# Patient Record
Sex: Male | Born: 1990 | Race: Black or African American | Hispanic: No | Marital: Single | Smoking: Current every day smoker
Health system: Southern US, Community
[De-identification: ages and names within clinical notes are randomized; demographics above are authoritative.]

## PROBLEM LIST (undated history)

## (undated) DIAGNOSIS — F410 Panic disorder [episodic paroxysmal anxiety] without agoraphobia: Secondary | ICD-10-CM

## (undated) DIAGNOSIS — F22 Delusional disorders: Secondary | ICD-10-CM

## (undated) DIAGNOSIS — G43909 Migraine, unspecified, not intractable, without status migrainosus: Secondary | ICD-10-CM

---
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
## 2011-12-24 ENCOUNTER — Emergency Department (HOSPITAL_COMMUNITY)
Admission: EM | Admit: 2011-12-24 | Discharge: 2011-12-24 | Disposition: A | Discharge status: Home / Self Care | Payer: No Typology Code available for payment source | Attending: Emergency Medicine | Admitting: Emergency Medicine

## 2011-12-24 ENCOUNTER — Encounter (HOSPITAL_COMMUNITY): Payer: Self-pay | Admitting: Emergency Medicine

## 2011-12-24 DIAGNOSIS — S0990XA Unspecified injury of head, initial encounter: Secondary | ICD-10-CM | POA: Insufficient documentation

## 2011-12-24 DIAGNOSIS — F17200 Nicotine dependence, unspecified, uncomplicated: Secondary | ICD-10-CM | POA: Insufficient documentation

## 2011-12-24 DIAGNOSIS — R51 Headache: Secondary | ICD-10-CM

## 2011-12-24 HISTORY — DX: Migraine, unspecified, not intractable, without status migrainosus: G43.909

## 2011-12-24 MED ORDER — ACETAMINOPHEN 325 MG PO TABS
650.0000 mg | ORAL_TABLET | Freq: Once | ORAL | Status: AC
  Administered 2011-12-24: 650 mg via ORAL
  Filled 2011-12-24: qty 2
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
## 2011-12-24 NOTE — ED Notes (Signed)
PA in room to see pt 
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
## 2011-12-24 NOTE — ED Provider Notes (Signed)
Medical screening examination/treatment/procedure(s) were performed by non-physician practitioner and as supervising physician I was immediately available for consultation/collaboration.   Benny Lennert, MD 12/24/11 2325

## 2013-09-10 ENCOUNTER — Encounter (HOSPITAL_COMMUNITY): Payer: Self-pay | Admitting: Emergency Medicine

## 2013-09-10 ENCOUNTER — Emergency Department (HOSPITAL_COMMUNITY)
Admission: EM | Admit: 2013-09-10 | Discharge: 2013-09-10 | Disposition: A | Discharge status: Home / Self Care | Payer: 59 | Attending: Emergency Medicine | Admitting: Emergency Medicine

## 2013-09-10 DIAGNOSIS — K5289 Other specified noninfective gastroenteritis and colitis: Secondary | ICD-10-CM | POA: Insufficient documentation

## 2013-09-10 DIAGNOSIS — K529 Noninfective gastroenteritis and colitis, unspecified: Secondary | ICD-10-CM

## 2013-09-10 DIAGNOSIS — R109 Unspecified abdominal pain: Secondary | ICD-10-CM

## 2013-09-10 DIAGNOSIS — E860 Dehydration: Secondary | ICD-10-CM | POA: Insufficient documentation

## 2013-09-10 DIAGNOSIS — F17200 Nicotine dependence, unspecified, uncomplicated: Secondary | ICD-10-CM | POA: Insufficient documentation

## 2013-09-10 DIAGNOSIS — R112 Nausea with vomiting, unspecified: Secondary | ICD-10-CM

## 2013-09-10 DIAGNOSIS — Z8679 Personal history of other diseases of the circulatory system: Secondary | ICD-10-CM | POA: Insufficient documentation

## 2013-09-10 DIAGNOSIS — R000 Tachycardia, unspecified: Secondary | ICD-10-CM | POA: Insufficient documentation

## 2013-09-10 LAB — CBC WITH DIFFERENTIAL/PLATELET
BASOS ABS: 0.1 10*3/uL (ref 0.0–0.1)
Basophils Relative: 1 % (ref 0–1)
Eosinophils Absolute: 0 10*3/uL (ref 0.0–0.7)
Eosinophils Relative: 1 % (ref 0–5)
HCT: 43 % (ref 39.0–52.0)
HEMOGLOBIN: 15 g/dL (ref 13.0–17.0)
LYMPHS PCT: 18 % (ref 12–46)
Lymphs Abs: 1 10*3/uL (ref 0.7–4.0)
MCH: 29 pg (ref 26.0–34.0)
MCHC: 34.9 g/dL (ref 30.0–36.0)
MCV: 83 fL (ref 78.0–100.0)
MONO ABS: 1.3 10*3/uL — AB (ref 0.1–1.0)
MONOS PCT: 22 % — AB (ref 3–12)
NEUTROS PCT: 59 % (ref 43–77)
Neutro Abs: 3.4 10*3/uL (ref 1.7–7.7)
PLATELETS: 180 10*3/uL (ref 150–400)
RBC: 5.18 MIL/uL (ref 4.22–5.81)
RDW: 13.9 % (ref 11.5–15.5)
WBC: 5.7 10*3/uL (ref 4.0–10.5)

## 2013-09-10 LAB — COMPREHENSIVE METABOLIC PANEL
ALT: 16 U/L (ref 0–53)
AST: 20 U/L (ref 0–37)
Albumin: 3.9 g/dL (ref 3.5–5.2)
Alkaline Phosphatase: 70 U/L (ref 39–117)
BUN: 8 mg/dL (ref 6–23)
CO2: 24 mEq/L (ref 19–32)
Calcium: 9.1 mg/dL (ref 8.4–10.5)
Chloride: 100 mEq/L (ref 96–112)
Creatinine, Ser: 0.76 mg/dL (ref 0.50–1.35)
GFR calc Af Amer: >90 mL/min (ref >90)
GFR calc non Af Amer: >90 mL/min (ref >90)
Glucose, Bld: 84 mg/dL (ref 70–99)
Potassium: 3.5 mEq/L — ABNORMAL LOW (ref 3.7–5.3)
Sodium: 138 mEq/L (ref 137–147)
Total Bilirubin: 0.7 mg/dL (ref 0.3–1.2)
Total Protein: 7 g/dL (ref 6.0–8.3)

## 2013-09-10 LAB — URINALYSIS, ROUTINE W REFLEX MICROSCOPIC
Bilirubin Urine: NEGATIVE
Glucose, UA: NEGATIVE mg/dL
Hgb urine dipstick: NEGATIVE
Ketones, ur: 40 mg/dL — AB
Nitrite: NEGATIVE
Protein, ur: NEGATIVE mg/dL
Specific Gravity, Urine: 1.021 (ref 1.005–1.030)
Urobilinogen, UA: 1 mg/dL (ref 0.0–1.0)
pH: 6.5 (ref 5.0–8.0)

## 2013-09-10 LAB — URINE MICROSCOPIC-ADD ON

## 2013-09-10 LAB — LIPASE, BLOOD: LIPASE: 12 U/L (ref 11–59)

## 2013-09-10 LAB — MONONUCLEOSIS SCREEN: Mono Screen: NEGATIVE

## 2013-09-10 MED ORDER — ONDANSETRON 4 MG PO TBDP
8.0000 mg | ORAL_TABLET | Freq: Once | ORAL | Status: AC
  Administered 2013-09-10: 8 mg via ORAL
  Filled 2013-09-10: qty 2

## 2013-09-10 MED ORDER — KETOROLAC TROMETHAMINE 30 MG/ML IJ SOLN
30.0000 mg | Freq: Once | INTRAMUSCULAR | Status: AC
  Administered 2013-09-10: 30 mg via INTRAVENOUS
  Filled 2013-09-10: qty 1

## 2013-09-10 MED ORDER — ONDANSETRON 4 MG PO TBDP: ORAL_TABLET | ORAL | Status: DC

## 2013-09-10 MED ORDER — SODIUM CHLORIDE 0.9 % IV BOLUS (SEPSIS)
1000.0000 mL | Freq: Once | INTRAVENOUS | Status: AC
  Administered 2013-09-10: 1000 mL via INTRAVENOUS

## 2013-09-10 MED ORDER — ONDANSETRON HCL 4 MG/2ML IJ SOLN
4.0000 mg | Freq: Once | INTRAMUSCULAR | Status: AC
  Administered 2013-09-10: 4 mg via INTRAVENOUS
  Filled 2013-09-10: qty 2

## 2013-09-10 NOTE — ED Notes (Signed)
Pt sts onset of pain and not feeling well and back pain yesterday, and 2100 last pm he got worse and started vomiting last pm at 2300.

## 2013-09-10 NOTE — ED Notes (Signed)
Ginger Ale given for fluid challenge.  

## 2013-09-10 NOTE — ED Provider Notes (Signed)
Medical screening examination/treatment/procedure(s) were performed by non-physician practitioner and as supervising physician I was immediately available for consultation/collaboration.    Jermaine HornJohn M Jermaine Vellucci, MD 09/10/13 2117

## 2013-09-10 NOTE — ED Notes (Signed)
Pt reports sudden onset of nausea, vomiting, subjective fever since yesterday. Generalized abdominal pain. Denies diarrhea. No sick contacts at home.

## 2013-09-10 NOTE — ED Notes (Signed)
Pt states that he doesn't care to drink because it makes his stomach hurt/feel queasy; however, he reports his abdominal pain has improved

## 2013-09-10 NOTE — ED Provider Notes (Signed)
CSN: [id number]     Arrival date & time 09/10/13  [id number] History   First MD Initiated Contact with Patient 09/10/13 1022     Chief Complaint  Patient presents with  . Abdominal Pain  . Emesis     (Consider location/radiation/quality/duration/timing/severity/associated sxs/prior Treatment) HPI Comments: Patient is a 23 year old male with a past medical history of migraines who presents to the emergency department complaining of sudden onset nausea, vomiting and subjective fever beginning yesterday evening. Patient states he was feeling fine earlier in the day, went to work, had to cough hurts. When he got home from work he started to feel nauseated, vomited multiple times, developed a slight headache and eventually left-sided abdominal pain. Abdominal pain is has been constant since, described as cramping, "pretty bad". No specific aggravating or alleviating factors. He had a few more episodes of emesis earlier today, a few with streaks of bright red blood. No sick contacts. No history of abdominal surgeries. Denies diarrhea.  Patient is a 23 y.o. male presenting with abdominal pain and vomiting. The history is provided by the patient.  Abdominal Pain Associated symptoms: fever, nausea and vomiting   Emesis Associated symptoms: abdominal pain     Past Medical History  Diagnosis Date  . Migraine    History reviewed. No pertinent past surgical history. History reviewed. No pertinent family history. History  Substance Use Topics  . Smoking status: Current Every Day Smoker    Types: Cigarettes  . Smokeless tobacco: Not on file  . Alcohol Use: No    Review of Systems  Constitutional: Positive for fever and appetite change.  Gastrointestinal: Positive for nausea, vomiting and abdominal pain.  All other systems reviewed and are negative.      Allergies  Augmentin  Home Medications   Current Outpatient Rx  Name  Route  Sig  Dispense  Refill  . ondansetron (ZOFRAN ODT) 4 MG  disintegrating tablet      4mg  ODT q4 hours prn nausea/vomit   8 tablet   0    BP 117/68  Pulse 79  Temp(Src) 99.5 F (37.5 C) (Oral)  Resp 17  SpO2 99% Physical Exam  Nursing note and vitals reviewed. Constitutional: He is oriented to person, place, and time. He appears well-developed and well-nourished. No distress.  HENT:  Head: Normocephalic and atraumatic.  Mouth/Throat: Oropharynx is clear and moist.  Eyes: Conjunctivae are normal.  Neck: Normal range of motion. Neck supple.  Cardiovascular: Regular rhythm and normal heart sounds.   Tachy ~100  Pulmonary/Chest: Effort normal and breath sounds normal.  Abdominal: Soft. Bowel sounds are normal. He exhibits no distension. There is no splenomegaly. There is tenderness in the epigastric area, periumbilical area, suprapubic area, left upper quadrant and left lower quadrant. There is guarding. There is no rigidity and no rebound.  No peritoneal signs. Tenderness worse left mid-lower quadrant.  Musculoskeletal: Normal range of motion. He exhibits no edema.  Neurological: He is alert and oriented to person, place, and time.  Skin: Skin is warm.  Clammy.  Psychiatric: He has a normal mood and affect. His behavior is normal.    ED Course  Procedures (including critical care time) Labs Review Labs Reviewed  CBC WITH DIFFERENTIAL - Abnormal; Notable for the following:    Monocytes Relative 22 (*)    Monocytes Absolute 1.3 (*)    All other components within normal limits  COMPREHENSIVE METABOLIC PANEL - Abnormal; Notable for the following:    Potassium 3.5 (*)  All other components within normal limits  URINALYSIS, ROUTINE W REFLEX MICROSCOPIC - Abnormal; Notable for the following:    APPearance CLOUDY (*)    Ketones, ur 40 (*)    Leukocytes, UA TRACE (*)    All other components within normal limits  LIPASE, BLOOD  MONONUCLEOSIS SCREEN  URINE MICROSCOPIC-ADD ON   Imaging Review No results found.   EKG  Interpretation None      MDM   Final diagnoses:  Abdominal pain  Nausea and vomiting  Gastroenteritis   Patient presenting with abdominal pain, nausea, vomiting and subjective fever. He appears in no apparent distress. Mild tachycardia, slightly clammy. No sick contacts. Temperature 99.5. Vitals otherwise stable. Labs pending. Pain and nausea medication given along with fluid bolus. No peritoneal signs concerning for acute abdomen. 12:53 PM Patient reports some improvement of his symptoms with fluid, Toradol and Zofran. Labs without acute findings. Slightly dehydrated. On repeat abdominal exam, abdomen still tender, however marked improvement from initial evaluation. Fluid challenge given, patient states he did not care to drink at this time but feels well now to go home. Stable for discharge. Will discharge him Zofran. Return precautions given. Patient states understanding of treatment care plan and is agreeable.   Trevor Mace, PA-C 09/10/13 1256

## 2013-09-10 NOTE — Discharge Instructions (Signed)
Take zofran as directed as needed for nausea.  Abdominal Pain, Adult Many things can cause abdominal pain. Usually, abdominal pain is not caused by a disease and will improve without treatment. It can often be observed and treated at home. Your health care provider will do a physical exam and possibly order blood tests and X-rays to help determine the seriousness of your pain. However, in many cases, more time must pass before a clear cause of the pain can be found. Before that point, your health care provider may not know if you need more testing or further treatment. HOME CARE INSTRUCTIONS  Monitor your abdominal pain for any changes. The following actions may help to alleviate any discomfort you are experiencing:  Only take over-the-counter or prescription medicines as directed by your health care provider.  Do not take laxatives unless directed to do so by your health care provider.  Try a clear liquid diet (broth, tea, or water) as directed by your health care provider. Slowly move to a bland diet as tolerated. SEEK MEDICAL CARE IF:  You have unexplained abdominal pain.  You have abdominal pain associated with nausea or diarrhea.  You have pain when you urinate or have a bowel movement.  You experience abdominal pain that wakes you in the night.  You have abdominal pain that is worsened or improved by eating food.  You have abdominal pain that is worsened with eating fatty foods. SEEK IMMEDIATE MEDICAL CARE IF:   Your pain does not go away within 2 hours.  You have a fever.  You keep throwing up (vomiting).  Your pain is felt only in portions of the abdomen, such as the right side or the left lower portion of the abdomen.  You pass bloody or black tarry stools. MAKE SURE YOU:  Understand these instructions.   Will watch your condition.   Will get help right away if you are not doing well or get worse.  Document Released: 03/17/2005 Document Revised: 03/28/2013  Document Reviewed: 02/14/2013 [facility] Patient Information 2014 [place], [place].  Viral Gastroenteritis Viral gastroenteritis is also known as stomach flu. This condition affects the stomach and intestinal tract. It can cause sudden diarrhea and vomiting. The illness typically lasts 3 to 8 days. Most people develop an immune response that eventually gets rid of the virus. While this natural response develops, the virus can make you quite ill. CAUSES  Many different viruses can cause gastroenteritis, such as rotavirus or noroviruses. You can catch one of these viruses by consuming contaminated food or water. You may also catch a virus by sharing utensils or other personal items with an infected person or by touching a contaminated surface. SYMPTOMS  The most common symptoms are diarrhea and vomiting. These problems can cause a severe loss of body fluids (dehydration) and a body salt (electrolyte) imbalance. Other symptoms may include:  Fever.  Headache.  Fatigue.  Abdominal pain. DIAGNOSIS  Your caregiver can usually diagnose viral gastroenteritis based on your symptoms and a physical exam. A stool sample may also be taken to test for the presence of viruses or other infections. TREATMENT  This illness typically goes away on its own. Treatments are aimed at rehydration. The most serious cases of viral gastroenteritis involve vomiting so severely that you are not able to keep fluids down. In these cases, fluids must be given through an intravenous line (IV). HOME CARE INSTRUCTIONS   Drink enough fluids to keep your urine clear or pale yellow. Drink small amounts of  fluids frequently and increase the amounts as tolerated.  Ask your caregiver for specific rehydration instructions.  Avoid:  Foods high in sugar.  Alcohol.  Carbonated drinks.  Tobacco.  Juice.  Caffeine drinks.  Extremely hot or cold fluids.  Fatty, greasy foods.  Too much intake of anything at one  time.  Dairy products until 24 to 48 hours after diarrhea stops.  You may consume probiotics. Probiotics are active cultures of beneficial bacteria. They may lessen the amount and number of diarrheal stools in adults. Probiotics can be found in yogurt with active cultures and in supplements.  Wash your hands well to avoid spreading the virus.  Only take over-the-counter or prescription medicines for pain, discomfort, or fever as directed by your caregiver. Do not give aspirin to children. Antidiarrheal medicines are not recommended.  Ask your caregiver if you should continue to take your regular prescribed and over-the-counter medicines.  Keep all follow-up appointments as directed by your caregiver. SEEK IMMEDIATE MEDICAL CARE IF:   You are unable to keep fluids down.  You do not urinate at least once every 6 to 8 hours.  You develop shortness of breath.  You notice blood in your stool or vomit. This may look like coffee grounds.  You have abdominal pain that increases or is concentrated in one small area (localized).  You have persistent vomiting or diarrhea.  You have a fever.  The patient is a child younger than 3 months, and he or she has a fever.  The patient is a child older than 3 months, and he or she has a fever and persistent symptoms.  The patient is a child older than 3 months, and he or she has a fever and symptoms suddenly get worse.  The patient is a baby, and he or she has no tears when crying. MAKE SURE YOU:   Understand these instructions.  Will watch your condition.  Will get help right away if you are not doing well or get worse. Document Released: 06/07/2005 Document Revised: 08/30/2011 Document Reviewed: 03/24/2011 [facility] Patient Information 2014 [place]

## 2015-06-19 ENCOUNTER — Emergency Department (HOSPITAL_COMMUNITY)
Admission: EM | Admit: 2015-06-19 | Discharge: 2015-06-19 | Disposition: A | Discharge status: Home / Self Care | Payer: 59 | Attending: Emergency Medicine | Admitting: Emergency Medicine

## 2015-06-19 ENCOUNTER — Encounter (HOSPITAL_COMMUNITY): Payer: Self-pay | Admitting: *Deleted

## 2015-06-19 DIAGNOSIS — F419 Anxiety disorder, unspecified: Secondary | ICD-10-CM | POA: Diagnosis not present

## 2015-06-19 DIAGNOSIS — Z8679 Personal history of other diseases of the circulatory system: Secondary | ICD-10-CM | POA: Insufficient documentation

## 2015-06-19 DIAGNOSIS — F17210 Nicotine dependence, cigarettes, uncomplicated: Secondary | ICD-10-CM | POA: Diagnosis not present

## 2015-06-19 DIAGNOSIS — R4182 Altered mental status, unspecified: Secondary | ICD-10-CM

## 2015-06-19 DIAGNOSIS — F1210 Cannabis abuse, uncomplicated: Secondary | ICD-10-CM | POA: Diagnosis not present

## 2015-06-19 LAB — CBC WITH DIFFERENTIAL/PLATELET
BASOS ABS: 0.1 10*3/uL (ref 0.0–0.1)
Basophils Relative: 2 %
EOS ABS: 0.1 10*3/uL (ref 0.0–0.7)
EOS PCT: 2 %
HCT: 45.9 % (ref 39.0–52.0)
Hemoglobin: 15.1 g/dL (ref 13.0–17.0)
LYMPHS PCT: 31 %
Lymphs Abs: 2.3 10*3/uL (ref 0.7–4.0)
MCH: 28 pg (ref 26.0–34.0)
MCHC: 32.9 g/dL (ref 30.0–36.0)
MCV: 85 fL (ref 78.0–100.0)
MONO ABS: 0.8 10*3/uL (ref 0.1–1.0)
Monocytes Relative: 11 %
Neutro Abs: 4 10*3/uL (ref 1.7–7.7)
Neutrophils Relative %: 54 %
PLATELETS: 255 10*3/uL (ref 150–400)
RBC: 5.4 MIL/uL (ref 4.22–5.81)
RDW: 13.7 % (ref 11.5–15.5)
WBC: 7.2 10*3/uL (ref 4.0–10.5)

## 2015-06-19 LAB — RAPID URINE DRUG SCREEN, HOSP PERFORMED
Amphetamines: NOT DETECTED
BARBITURATES: NOT DETECTED
Benzodiazepines: NOT DETECTED
COCAINE: NOT DETECTED
Opiates: NOT DETECTED
Tetrahydrocannabinol: POSITIVE — AB

## 2015-06-19 LAB — BASIC METABOLIC PANEL
Anion gap: 9 (ref 5–15)
BUN: 13 mg/dL (ref 6–20)
CHLORIDE: 110 mmol/L (ref 101–111)
CO2: 20 mmol/L — AB (ref 22–32)
CREATININE: 0.89 mg/dL (ref 0.61–1.24)
Calcium: 8.7 mg/dL — ABNORMAL LOW (ref 8.9–10.3)
GFR calc Af Amer: >60 mL/min (ref >60)
GFR calc non Af Amer: >60 mL/min (ref >60)
GLUCOSE: 102 mg/dL — AB (ref 65–99)
Potassium: 3.7 mmol/L (ref 3.5–5.1)
SODIUM: 139 mmol/L (ref 135–145)

## 2015-06-19 LAB — ETHANOL: Alcohol, Ethyl (B): <5 mg/dL (ref <5)

## 2015-06-19 NOTE — ED Notes (Signed)
Patient and SO asking when he is going to be discharged.  Explained that he needs to talk with the MD because he cannot discharge someone who is not talking to him.
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
## 2015-06-19 NOTE — ED Notes (Signed)
Patient now alert and talking to this nurse.  Requested something to eat and drink.  C/o IV hurting - no redness or swelling noted

## 2015-06-19 NOTE — Discharge Instructions (Signed)
Return to the emergency department if you experience any new or concerning symptoms. °

## 2015-10-12 ENCOUNTER — Emergency Department (HOSPITAL_COMMUNITY): Payer: 59

## 2015-10-12 ENCOUNTER — Emergency Department (HOSPITAL_COMMUNITY)
Admission: EM | Admit: 2015-10-12 | Discharge: 2015-10-12 | Disposition: A | Discharge status: Home / Self Care | Payer: 59 | Attending: Emergency Medicine | Admitting: Emergency Medicine

## 2015-10-12 ENCOUNTER — Encounter (HOSPITAL_COMMUNITY): Payer: Self-pay | Admitting: *Deleted

## 2015-10-12 DIAGNOSIS — F419 Anxiety disorder, unspecified: Secondary | ICD-10-CM | POA: Diagnosis not present

## 2015-10-12 DIAGNOSIS — Z8679 Personal history of other diseases of the circulatory system: Secondary | ICD-10-CM | POA: Diagnosis not present

## 2015-10-12 DIAGNOSIS — Y9389 Activity, other specified: Secondary | ICD-10-CM | POA: Insufficient documentation

## 2015-10-12 DIAGNOSIS — F410 Panic disorder [episodic paroxysmal anxiety] without agoraphobia: Secondary | ICD-10-CM

## 2015-10-12 DIAGNOSIS — R0789 Other chest pain: Secondary | ICD-10-CM | POA: Insufficient documentation

## 2015-10-12 DIAGNOSIS — Z79899 Other long term (current) drug therapy: Secondary | ICD-10-CM | POA: Diagnosis not present

## 2015-10-12 DIAGNOSIS — R1111 Vomiting without nausea: Secondary | ICD-10-CM

## 2015-10-12 DIAGNOSIS — W260XXA Contact with knife, initial encounter: Secondary | ICD-10-CM | POA: Insufficient documentation

## 2015-10-12 DIAGNOSIS — F1210 Cannabis abuse, uncomplicated: Secondary | ICD-10-CM | POA: Diagnosis not present

## 2015-10-12 DIAGNOSIS — Y998 Other external cause status: Secondary | ICD-10-CM | POA: Diagnosis not present

## 2015-10-12 DIAGNOSIS — S41112A Laceration without foreign body of left upper arm, initial encounter: Secondary | ICD-10-CM | POA: Diagnosis not present

## 2015-10-12 DIAGNOSIS — R079 Chest pain, unspecified: Secondary | ICD-10-CM | POA: Diagnosis present

## 2015-10-12 DIAGNOSIS — Y9289 Other specified places as the place of occurrence of the external cause: Secondary | ICD-10-CM | POA: Diagnosis not present

## 2015-10-12 DIAGNOSIS — F17210 Nicotine dependence, cigarettes, uncomplicated: Secondary | ICD-10-CM | POA: Insufficient documentation

## 2015-10-12 HISTORY — DX: Panic disorder (episodic paroxysmal anxiety): F41.0

## 2015-10-12 LAB — COMPREHENSIVE METABOLIC PANEL
ALT: 19 U/L (ref 17–63)
ANION GAP: 18 — AB (ref 5–15)
AST: 27 U/L (ref 15–41)
Albumin: 3.9 g/dL (ref 3.5–5.0)
Alkaline Phosphatase: 58 U/L (ref 38–126)
BILIRUBIN TOTAL: 1.7 mg/dL — AB (ref 0.3–1.2)
BUN: 8 mg/dL (ref 6–20)
CHLORIDE: 102 mmol/L (ref 101–111)
CO2: 18 mmol/L — ABNORMAL LOW (ref 22–32)
Calcium: 9.2 mg/dL (ref 8.9–10.3)
Creatinine, Ser: 1.1 mg/dL (ref 0.61–1.24)
Glucose, Bld: 70 mg/dL (ref 65–99)
POTASSIUM: 3.6 mmol/L (ref 3.5–5.1)
Sodium: 138 mmol/L (ref 135–145)
TOTAL PROTEIN: 6.3 g/dL — AB (ref 6.5–8.1)

## 2015-10-12 LAB — RAPID URINE DRUG SCREEN, HOSP PERFORMED
AMPHETAMINES: NOT DETECTED
BENZODIAZEPINES: NOT DETECTED
Barbiturates: NOT DETECTED
COCAINE: NOT DETECTED
OPIATES: NOT DETECTED
TETRAHYDROCANNABINOL: POSITIVE — AB

## 2015-10-12 LAB — I-STAT TROPONIN, ED: TROPONIN I, POC: 0 ng/mL (ref 0.00–0.08)

## 2015-10-12 LAB — CBC
HCT: 44.1 % (ref 39.0–52.0)
HEMOGLOBIN: 15.1 g/dL (ref 13.0–17.0)
MCH: 28.8 pg (ref 26.0–34.0)
MCHC: 34.2 g/dL (ref 30.0–36.0)
MCV: 84 fL (ref 78.0–100.0)
PLATELETS: 260 10*3/uL (ref 150–400)
RBC: 5.25 MIL/uL (ref 4.22–5.81)
RDW: 14.3 % (ref 11.5–15.5)
WBC: 13.3 10*3/uL — ABNORMAL HIGH (ref 4.0–10.5)

## 2015-10-12 LAB — POC OCCULT BLOOD, ED: Fecal Occult Bld: NEGATIVE

## 2015-10-12 MED ORDER — PANTOPRAZOLE SODIUM 40 MG IV SOLR
40.0000 mg | Freq: Once | INTRAVENOUS | Status: AC
  Administered 2015-10-12: 40 mg via INTRAVENOUS
  Filled 2015-10-12: qty 40

## 2015-10-12 MED ORDER — OMEPRAZOLE 20 MG PO CPDR: 20.0000 mg | DELAYED_RELEASE_CAPSULE | Freq: Every day | ORAL | Status: AC

## 2015-10-12 MED ORDER — ALPRAZOLAM 0.25 MG PO TABS: 0.2500 mg | ORAL_TABLET | Freq: Three times a day (TID) | ORAL | Status: AC | PRN

## 2015-10-12 MED ORDER — ALPRAZOLAM 0.25 MG PO TABS
0.2500 mg | ORAL_TABLET | Freq: Once | ORAL | Status: AC
  Administered 2015-10-12: 0.25 mg via ORAL
  Filled 2015-10-12: qty 1

## 2015-10-12 MED ORDER — SODIUM CHLORIDE 0.9 % IV SOLN
1000.0000 mL | Freq: Once | INTRAVENOUS | Status: AC
  Administered 2015-10-12: 1000 mL via INTRAVENOUS

## 2015-10-12 MED ORDER — LORAZEPAM 2 MG/ML IJ SOLN
1.0000 mg | Freq: Once | INTRAMUSCULAR | Status: AC
  Administered 2015-10-12: 1 mg via INTRAVENOUS
  Filled 2015-10-12: qty 1

## 2015-10-12 MED ORDER — SODIUM CHLORIDE 0.9 % IV SOLN
1000.0000 mL | INTRAVENOUS | Status: DC
Start: 2015-10-12 — End: 2015-10-12
  Administered 2015-10-12: 1000 mL via INTRAVENOUS

## 2015-10-12 MED ORDER — SODIUM CHLORIDE 0.9 % IV BOLUS (SEPSIS): 1000.0000 mL | Freq: Once | INTRAVENOUS | Status: DC

## 2015-10-12 NOTE — ED Notes (Signed)
PT unable to provide UA at this time... 
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
## 2015-10-12 NOTE — ED Notes (Signed)
PT still unable to provide UA at this time.

## 2015-10-12 NOTE — ED Notes (Signed)
Patient transported to X-ray 

## 2015-10-12 NOTE — ED Notes (Signed)
Requested PT to provide UA, "he is working on it"

## 2015-10-12 NOTE — Discharge Instructions (Signed)
Suspected Gastritis, Adult Gastritis is soreness and swelling (inflammation) of the lining of the stomach. Gastritis can develop as a sudden onset (acute) or long-term (chronic) condition. If gastritis is not treated, it can lead to stomach bleeding and ulcers. CAUSES  Gastritis occurs when the stomach lining is weak or damaged. Digestive juices from the stomach then inflame the weakened stomach lining. The stomach lining may be weak or damaged due to viral or bacterial infections. One common bacterial infection is the Helicobacter pylori infection. Gastritis can also result from excessive alcohol consumption, taking certain medicines, or having too much acid in the stomach.  SYMPTOMS  In some cases, there are no symptoms. When symptoms are present, they may include:  Pain or a burning sensation in the upper abdomen.  Nausea.  Vomiting.  An uncomfortable feeling of fullness after eating. DIAGNOSIS  Your caregiver may suspect you have gastritis based on your symptoms and a physical exam. To determine the cause of your gastritis, your caregiver may perform the following:  Blood or stool tests to check for the H pylori bacterium.  Gastroscopy. A thin, flexible tube (endoscope) is passed down the esophagus and into the stomach. The endoscope has a light and camera on the end. Your caregiver uses the endoscope to view the inside of the stomach.  Taking a tissue sample (biopsy) from the stomach to examine under a microscope. TREATMENT  Depending on the cause of your gastritis, medicines may be prescribed. If you have a bacterial infection, such as an H pylori infection, antibiotics may be given. If your gastritis is caused by too much acid in the stomach, H2 blockers or antacids may be given. Your caregiver may recommend that you stop taking aspirin, ibuprofen, or other nonsteroidal anti-inflammatory drugs (NSAIDs). HOME CARE INSTRUCTIONS  Only take over-the-counter or prescription medicines as  directed by your caregiver.  If you were given antibiotic medicines, take them as directed. Finish them even if you start to feel better.  Drink enough fluids to keep your urine clear or pale yellow.  Avoid foods and drinks that make your symptoms worse, such as:  Caffeine or alcoholic drinks.  Chocolate.  Peppermint or mint flavorings.  Garlic and onions.  Spicy foods.  Citrus fruits, such as oranges, lemons, or limes.  Tomato-based foods such as sauce, chili, salsa, and pizza.  Fried and fatty foods.  Eat small, frequent meals instead of large meals. SEEK IMMEDIATE MEDICAL CARE IF:   You have black or dark red stools.  You vomit blood or material that looks like coffee grounds.  You are unable to keep fluids down.  Your abdominal pain gets worse.  You have a fever.  You do not feel better after 1 week.  You have any other questions or concerns. MAKE SURE YOU:  Understand these instructions.  Will watch your condition.  Will get help right away if you are not doing well or get worse.   This information is not intended to replace advice given to you by your health care provider. Make sure you discuss any questions you have with your health care provider.   Document Released: 06/01/2001 Document Revised: 12/07/2011 Document Reviewed: 07/21/2011 Elsevier Interactive Patient Education 2016 Elsevier Inc[employer] Panic Attacks Panic attacks are sudden, short-livedsurges of severe anxiety, fear, or discomfort. They may occur for no reason when you are relaxed, when you are anxious, or when you are sleeping. Panic attacks may occur for a number of reasons:   Healthy people occasionally have panic attacks  in extreme, life-threatening situations, such as war or natural disasters. Normal anxiety is a protective mechanism of the body that helps [place] react to danger (fight or flight response).  Panic attacks are often seen with anxiety disorders, such as panic disorder, social  anxiety disorder, generalized anxiety disorder, and phobias. Anxiety disorders cause excessive or uncontrollable anxiety. They may interfere with your relationships or other life activities.  Panic attacks are sometimes seen with other mental illnesses, such as depression and posttraumatic stress disorder.  Certain medical conditions, prescription medicines, and drugs of abuse can cause panic attacks. SYMPTOMS  Panic attacks start suddenly, peak within 20 minutes, and are accompanied by four or more of the following symptoms:  Pounding heart or fast heart rate (palpitations).  Sweating.  Trembling or shaking.  Shortness of breath or feeling smothered.  Feeling choked.  Chest pain or discomfort.  Nausea or strange feeling in your stomach.  Dizziness, light-headedness, or feeling like you will faint.  Chills or hot flushes.  Numbness or tingling in your lips or hands and feet.  Feeling that things are not real or feeling that you are not yourself.  Fear of losing control or going crazy.  Fear of dying. Some of these symptoms can mimic serious medical conditions. For example, you may think you are having a heart attack. Although panic attacks can be very scary, they are not life threatening. DIAGNOSIS  Panic attacks are diagnosed through an assessment by your health care provider. Your health care provider will ask questions about your symptoms, such as where and when they occurred. Your health care provider will also ask about your medical history and use of alcohol and drugs, including prescription medicines. Your health care provider may order blood tests or other studies to rule out a serious medical condition. Your health care provider may refer you to a mental health professional for further evaluation. TREATMENT   Most healthy people who have one or two panic attacks in an extreme, life-threatening situation will not require treatment.  The treatment for panic attacks  associated with anxiety disorders or other mental illness typically involves counseling with a mental health professional, medicine, or a combination of both. Your health care provider will help determine what treatment is best for you.  Panic attacks due to physical illness usually go away with treatment of the illness. If prescription medicine is causing panic attacks, talk with your health care provider about stopping the medicine, decreasing the dose, or substituting another medicine.  Panic attacks due to alcohol or drug abuse go away with abstinence. Some adults need professional help in order to stop drinking or using drugs. HOME CARE INSTRUCTIONS   Take all medicines as directed by your health care provider.   Schedule and attend follow-up visits as directed by your health care provider. It is important to keep all your appointments. SEEK MEDICAL CARE IF:  You are not able to take your medicines as prescribed.  Your symptoms do not improve or get worse. SEEK IMMEDIATE MEDICAL CARE IF:   You experience panic attack symptoms that are different than your usual symptoms.  You have serious thoughts about hurting yourself or others.  You are taking medicine for panic attacks and have a serious side effect. MAKE SURE YOU:  Understand these instructions.  Will watch your condition.  Will get help right away if you are not doing well or get worse.   This information is not intended to replace advice given to you by your health  care provider. Make sure you discuss any questions you have with your health care provider.   Document Released: 06/07/2005 Document Revised: 06/12/2013 Document Reviewed: 01/19/2013 Elsevier Interactive Patient Education 2016 Elsevier Inc. Nonspecific Chest Pain  Chest pain can be caused by many different conditions. There is always a chance that your pain could be related to something serious, such as a heart attack or a blood clot in your lungs. Chest  pain can also be caused by conditions that are not life-threatening. If you have chest pain, it is very important to follow up with your health care provider. CAUSES  Chest pain can be caused by:  Heartburn.  Pneumonia or bronchitis.  Anxiety or stress.  Inflammation around your heart (pericarditis) or lung (pleuritis or pleurisy).  A blood clot in your lung.  A collapsed lung (pneumothorax). It can develop suddenly on its own (spontaneous pneumothorax) or from trauma to the chest.  Shingles infection (varicella-zoster virus).  Heart attack.  Damage to the bones, muscles, and cartilage that make up your chest wall. This can include:  Bruised bones due to injury.  Strained muscles or cartilage due to frequent or repeated coughing or overwork.  Fracture to one or more ribs.  Sore cartilage due to inflammation (costochondritis). RISK FACTORS  Risk factors for chest pain may include:  Activities that increase your risk for trauma or injury to your chest.  Respiratory infections or conditions that cause frequent coughing.  Medical conditions or overeating that can cause heartburn.  Heart disease or family history of heart disease.  Conditions or health behaviors that increase your risk of developing a blood clot.  Having had chicken pox (varicella zoster). SIGNS AND SYMPTOMS Chest pain can feel like:  Burning or tingling on the surface of your chest or deep in your chest.  Crushing, pressure, aching, or squeezing pain.  Dull or sharp pain that is worse when you move, cough, or take a deep breath.  Pain that is also felt in your back, neck, shoulder, or arm, or pain that spreads to any of these areas. Your chest pain may come and go, or it may stay constant. DIAGNOSIS Lab tests or other studies may be needed to find the cause of your pain. Your health care provider may have you take a test called an ambulatory ECG (electrocardiogram). An ECG records your heartbeat  patterns at the time the test is performed. You may also have other tests, such as:  Transthoracic echocardiogram (TTE). During echocardiography, sound waves are used to create a picture of all of the heart structures and to look at how blood flows through your heart.  Transesophageal echocardiogram (TEE).This is a more advanced imaging test that obtains images from inside your body. It allows your health care provider to see your heart in finer detail.  Cardiac monitoring. This allows your health care provider to monitor your heart rate and rhythm in real time.  Holter monitor. This is a portable device that records your heartbeat and can help to diagnose abnormal heartbeats. It allows your health care provider to track your heart activity for several days, if needed.  Stress tests. These can be done through exercise or by taking medicine that makes your heart beat more quickly.  Blood tests.  Imaging tests. TREATMENT  Your treatment depends on what is causing your chest pain. Treatment may include:  Medicines. These may include:  Acid blockers for heartburn.  Anti-inflammatory medicine.  Pain medicine for inflammatory conditions.  Antibiotic medicine, if an  infection is present.  Medicines to dissolve blood clots.  Medicines to treat coronary artery disease.  Supportive care for conditions that do not require medicines. This may include:  Resting.  Applying heat or cold packs to injured areas.  Limiting activities until pain decreases. HOME CARE INSTRUCTIONS  If you were prescribed an antibiotic medicine, finish it all even if you start to feel better.  Avoid any activities that bring on chest pain.  Do not use any tobacco products, including cigarettes, chewing tobacco, or electronic cigarettes. If you need help quitting, ask your health care provider.  Do not drink alcohol.  Take medicines only as directed by your health care provider.  Keep all follow-up  visits as directed by your health care provider. This is important. This includes any further testing if your chest pain does not go away.  If heartburn is the cause for your chest pain, you may be told to keep your head raised (elevated) while sleeping. This reduces the chance that acid will go from your stomach into your esophagus.  Make lifestyle changes as directed by your health care provider. These may include:  Getting regular exercise. Ask your health care provider to suggest some activities that are safe for you.  Eating a heart-healthy diet. A registered dietitian can help you to learn healthy eating options.  Maintaining a healthy weight.  Managing diabetes, if necessary.  Reducing stress. SEEK MEDICAL CARE IF:  Your chest pain does not go away after treatment.  You have a rash with blisters on your chest.  You have a fever. SEEK IMMEDIATE MEDICAL CARE IF:   Your chest pain is worse.  You have an increasing cough, or you cough up blood.  You have severe abdominal pain.  You have severe weakness.  You faint.  You have chills.  You have sudden, unexplained chest discomfort.  You have sudden, unexplained discomfort in your arms, back, neck, or jaw.  You have shortness of breath at any time.  You suddenly start to sweat, or your skin gets clammy.  You feel nauseous or you vomit.  You suddenly feel light-headed or dizzy.  Your heart begins to beat quickly, or it feels like it is skipping beats. These symptoms may represent a serious problem that is an emergency. Do not wait to see if the symptoms will go away. Get medical help right away. Call your local emergency services (911 in the [place] Do not drive yourself to the hospital.   This information is not intended to replace advice given to you by your health care provider. Make sure you discuss any questions you have with your health care provider.   Document Released: 03/17/2005 Document Revised:  06/28/2014 Document Reviewed: 01/11/2014 Elsevier Interactive Patient Education [employer]

## 2015-10-12 NOTE — ED Notes (Signed)
Anxiety somewhat improved.

## 2015-10-12 NOTE — ED Notes (Signed)
[facility] was initially called for rectal bleed.  When they arrived pt was in high state of anxiety, stating that he had chest pain.  Also stated 1 bm of bloody stool and multiple episodes of bloody vomit.

## 2015-10-14 ENCOUNTER — Encounter (HOSPITAL_COMMUNITY): Payer: Self-pay | Admitting: *Deleted

## 2015-10-14 ENCOUNTER — Emergency Department (HOSPITAL_COMMUNITY)
Admission: EM | Admit: 2015-10-14 | Discharge: 2015-10-14 | Disposition: A | Discharge status: Home / Self Care | Payer: 59 | Attending: Emergency Medicine | Admitting: Emergency Medicine

## 2015-10-14 DIAGNOSIS — Z79899 Other long term (current) drug therapy: Secondary | ICD-10-CM | POA: Insufficient documentation

## 2015-10-14 DIAGNOSIS — F1310 Sedative, hypnotic or anxiolytic abuse, uncomplicated: Secondary | ICD-10-CM | POA: Diagnosis not present

## 2015-10-14 DIAGNOSIS — F17210 Nicotine dependence, cigarettes, uncomplicated: Secondary | ICD-10-CM | POA: Insufficient documentation

## 2015-10-14 DIAGNOSIS — F1210 Cannabis abuse, uncomplicated: Secondary | ICD-10-CM | POA: Insufficient documentation

## 2015-10-14 DIAGNOSIS — F439 Reaction to severe stress, unspecified: Secondary | ICD-10-CM | POA: Insufficient documentation

## 2015-10-14 DIAGNOSIS — Z8679 Personal history of other diseases of the circulatory system: Secondary | ICD-10-CM | POA: Insufficient documentation

## 2015-10-14 DIAGNOSIS — F410 Panic disorder [episodic paroxysmal anxiety] without agoraphobia: Secondary | ICD-10-CM | POA: Insufficient documentation

## 2015-10-14 DIAGNOSIS — Z046 Encounter for general psychiatric examination, requested by authority: Secondary | ICD-10-CM | POA: Diagnosis present

## 2015-10-14 HISTORY — DX: Delusional disorders: F22

## 2015-10-14 LAB — RAPID URINE DRUG SCREEN, HOSP PERFORMED
Amphetamines: NOT DETECTED
BARBITURATES: NOT DETECTED
Benzodiazepines: POSITIVE — AB
Cocaine: NOT DETECTED
Opiates: NOT DETECTED
TETRAHYDROCANNABINOL: POSITIVE — AB

## 2015-10-14 LAB — CBC
HEMATOCRIT: 45.5 % (ref 39.0–52.0)
HEMOGLOBIN: 16.2 g/dL (ref 13.0–17.0)
MCH: 29.5 pg (ref 26.0–34.0)
MCHC: 35.6 g/dL (ref 30.0–36.0)
MCV: 82.9 fL (ref 78.0–100.0)
Platelets: 267 10*3/uL (ref 150–400)
RBC: 5.49 MIL/uL (ref 4.22–5.81)
RDW: 14.2 % (ref 11.5–15.5)
WBC: 10.4 10*3/uL (ref 4.0–10.5)

## 2015-10-14 LAB — URINALYSIS, ROUTINE W REFLEX MICROSCOPIC
Glucose, UA: NEGATIVE mg/dL
Hgb urine dipstick: NEGATIVE
KETONES UR: 15 mg/dL — AB
NITRITE: NEGATIVE
PH: 6.5 (ref 5.0–8.0)
Protein, ur: 30 mg/dL — AB
SPECIFIC GRAVITY, URINE: 1.029 (ref 1.005–1.030)

## 2015-10-14 LAB — URINE MICROSCOPIC-ADD ON

## 2015-10-14 LAB — COMPREHENSIVE METABOLIC PANEL
ALBUMIN: 4.4 g/dL (ref 3.5–5.0)
ALK PHOS: 58 U/L (ref 38–126)
ALT: 28 U/L (ref 17–63)
AST: 28 U/L (ref 15–41)
Anion gap: 10 (ref 5–15)
BILIRUBIN TOTAL: 1 mg/dL (ref 0.3–1.2)
BUN: 5 mg/dL — ABNORMAL LOW (ref 6–20)
CALCIUM: 9.5 mg/dL (ref 8.9–10.3)
CO2: 25 mmol/L (ref 22–32)
CREATININE: 0.91 mg/dL (ref 0.61–1.24)
Chloride: 107 mmol/L (ref 101–111)
GFR calc Af Amer: >60 mL/min (ref >60)
GFR calc non Af Amer: >60 mL/min (ref >60)
GLUCOSE: 105 mg/dL — AB (ref 65–99)
Potassium: 3.4 mmol/L — ABNORMAL LOW (ref 3.5–5.1)
SODIUM: 142 mmol/L (ref 135–145)
Total Protein: 7.1 g/dL (ref 6.5–8.1)

## 2015-10-14 LAB — ETHANOL: Alcohol, Ethyl (B): <5 mg/dL (ref <5)

## 2015-10-14 LAB — SALICYLATE LEVEL: Salicylate Lvl: <4 mg/dL (ref 2.8–30.0)

## 2015-10-14 LAB — ACETAMINOPHEN LEVEL: Acetaminophen (Tylenol), Serum: <10 ug/mL — ABNORMAL LOW (ref 10–30)

## 2015-10-14 MED ORDER — ACETAMINOPHEN 325 MG PO TABS: 650.0000 mg | ORAL_TABLET | ORAL | Status: DC | PRN

## 2015-10-14 MED ORDER — LORAZEPAM 1 MG PO TABS: 1.0000 mg | ORAL_TABLET | Freq: Three times a day (TID) | ORAL | Status: DC | PRN

## 2015-10-14 MED ORDER — ONDANSETRON HCL 4 MG PO TABS: 4.0000 mg | ORAL_TABLET | Freq: Three times a day (TID) | ORAL | Status: DC | PRN

## 2015-10-14 MED ORDER — IBUPROFEN 200 MG PO TABS: 600.0000 mg | ORAL_TABLET | Freq: Three times a day (TID) | ORAL | Status: DC | PRN

## 2015-10-14 NOTE — Discharge Instructions (Signed)
Stress and Stress Management °Stress is a normal reaction to life events. It is what you feel when life demands more than you are used to or more than you can handle. Some stress can be useful. For example, the stress reaction can help you catch the last bus of the day, study for a test, or meet a deadline at work. But stress that occurs too often or for too long can cause problems. It can affect your emotional health and interfere with relationships and normal daily activities. Too much stress can weaken your immune system and increase your risk for physical illness. If you already have a medical problem, stress can make it worse. °CAUSES  °All sorts of life events may cause stress. An event that causes stress for one person may not be stressful for another person. Major life events commonly cause stress. These may be positive or negative. Examples include losing your job, moving into a new home, getting married, having a baby, or losing a loved one. Less obvious life events may also cause stress, especially if they occur day after day or in combination. Examples include working long hours, driving in traffic, caring for children, being in debt, or being in a difficult relationship. °SIGNS AND SYMPTOMS °Stress may cause emotional symptoms including, the following: °· Anxiety. This is feeling worried, afraid, on edge, overwhelmed, or out of control. °· Anger. This is feeling irritated or impatient. °· Depression. This is feeling sad, down, helpless, or guilty. °· Difficulty focusing, remembering, or making decisions. °Stress may cause physical symptoms, including the following:  °· Aches and pains. These may affect your head, neck, back, stomach, or other areas of your body. °· Tight muscles or clenched jaw. °· Low energy or trouble sleeping.  °Stress may cause unhealthy behaviors, including the following:  °· Eating to feel better (overeating) or skipping meals. °· Sleeping too little, too much, or both. °· Working  too much or putting off tasks (procrastination). °· Smoking, drinking alcohol, or using drugs to feel better. °DIAGNOSIS  °Stress is diagnosed through an assessment by your health care provider. Your health care provider will ask questions about your symptoms and any stressful life events. Your health care provider will also ask about your medical history and may order blood tests or other tests. Certain medical conditions and medicine can cause physical symptoms similar to stress.  Mental illness can cause emotional symptoms and unhealthy behaviors similar to stress. Your health care provider may refer you to a mental health professional for further evaluation.  °TREATMENT  °Stress management is the recommended treatment for stress. The goals of stress management are reducing stressful life events and coping with stress in healthy ways.  °Techniques for reducing stressful life events include the following: °· Stress identification. Self-monitor for stress and identify what causes stress for you. These skills may help you to avoid some stressful events. °· Time management. Set your priorities, keep a calendar of events, and learn to say "no." These tools can help you avoid making too many commitments. °Techniques for coping with stress include the following: °· Rethinking the problem. Try to think realistically about stressful events rather than ignoring them or overreacting. Try to find the positives in a stressful situation rather than focusing on the negatives. °· Exercise. Physical exercise can release both physical and emotional tension. The key is to find a form of exercise you enjoy and do it regularly. °· Relaxation techniques. These relax the body and mind. Examples include yoga, meditation, tai chi, biofeedback, deep   breathing, progressive muscle relaxation, listening to music, being out in nature, journaling, and other hobbies. Again, the key is to find one or more that you enjoy and can do  regularly.  Healthy lifestyle. Eat a balanced diet, get plenty of sleep, and do not smoke. Avoid using alcohol or drugs to relax.  Strong support network. Spend time with family, friends, or other people you enjoy being around.Express your feelings and talk things over with someone you trust. Counseling or talktherapy with a mental health professional may be helpful if you are having difficulty managing stress on your own. Medicine is typically not recommended for the treatment of stress.Talk to your health care provider if you think you need medicine for symptoms of stress. HOME CARE INSTRUCTIONS  Keep all follow-up visits as directed by your health care provider.  Take all medicines as directed by your health care provider. SEEK MEDICAL CARE IF:  Your symptoms get worse or you start having new symptoms.  You feel overwhelmed by your problems and can no longer manage them on your own. SEEK IMMEDIATE MEDICAL CARE IF:  You feel like hurting yourself or someone else.   This information is not intended to replace advice given to you by your health care provider. Make sure you discuss any questions you have with your health care provider.   Document Released: 12/01/2000 Document Revised: 06/28/2014 Document Reviewed: 01/30/2013 Elsevier Interactive Patient Education [employer].

## 2015-10-14 NOTE — BH Assessment (Signed)
Assessment completed. Consulted Donell SievertSpencer Simon, PA-C who agrees that pt does not meet inpatient criteria. Pt denies SI, HI and AVH at this time.  Informed Dr. Madilyn Hookees of the recommendation. Pt's IVC will be rescinded.
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
## 2015-10-14 NOTE — ED Notes (Signed)
Patient denies SI, HI, Avh patient discharged to home. Patient denies pain anxiety, and depression at this time. Pt signed all discharge paperwork and left to waiting ride, all belongings returned.

## 2015-10-14 NOTE — ED Notes (Signed)
Pt oriented to room and unit.  Pt denies S/I, H/I, and AVH.  Pt understands that he is being seen for paranoia.  15 minute checks and video monitoring in place.
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
## 2016-12-10 IMAGING — CR DG CHEST 2V
2 series · 2 of 2 positions shown · non-contrast
Comparison: None.

CLINICAL DATA: Pt c/o central chest pain, SOB, left-sided abdominal
pain, coughing up blood, and blood in his stool x 1 day. No hx of
heart or lung problems. Pt is a current smoker.

EXAM:
CHEST  2 VIEW

[chest pa]
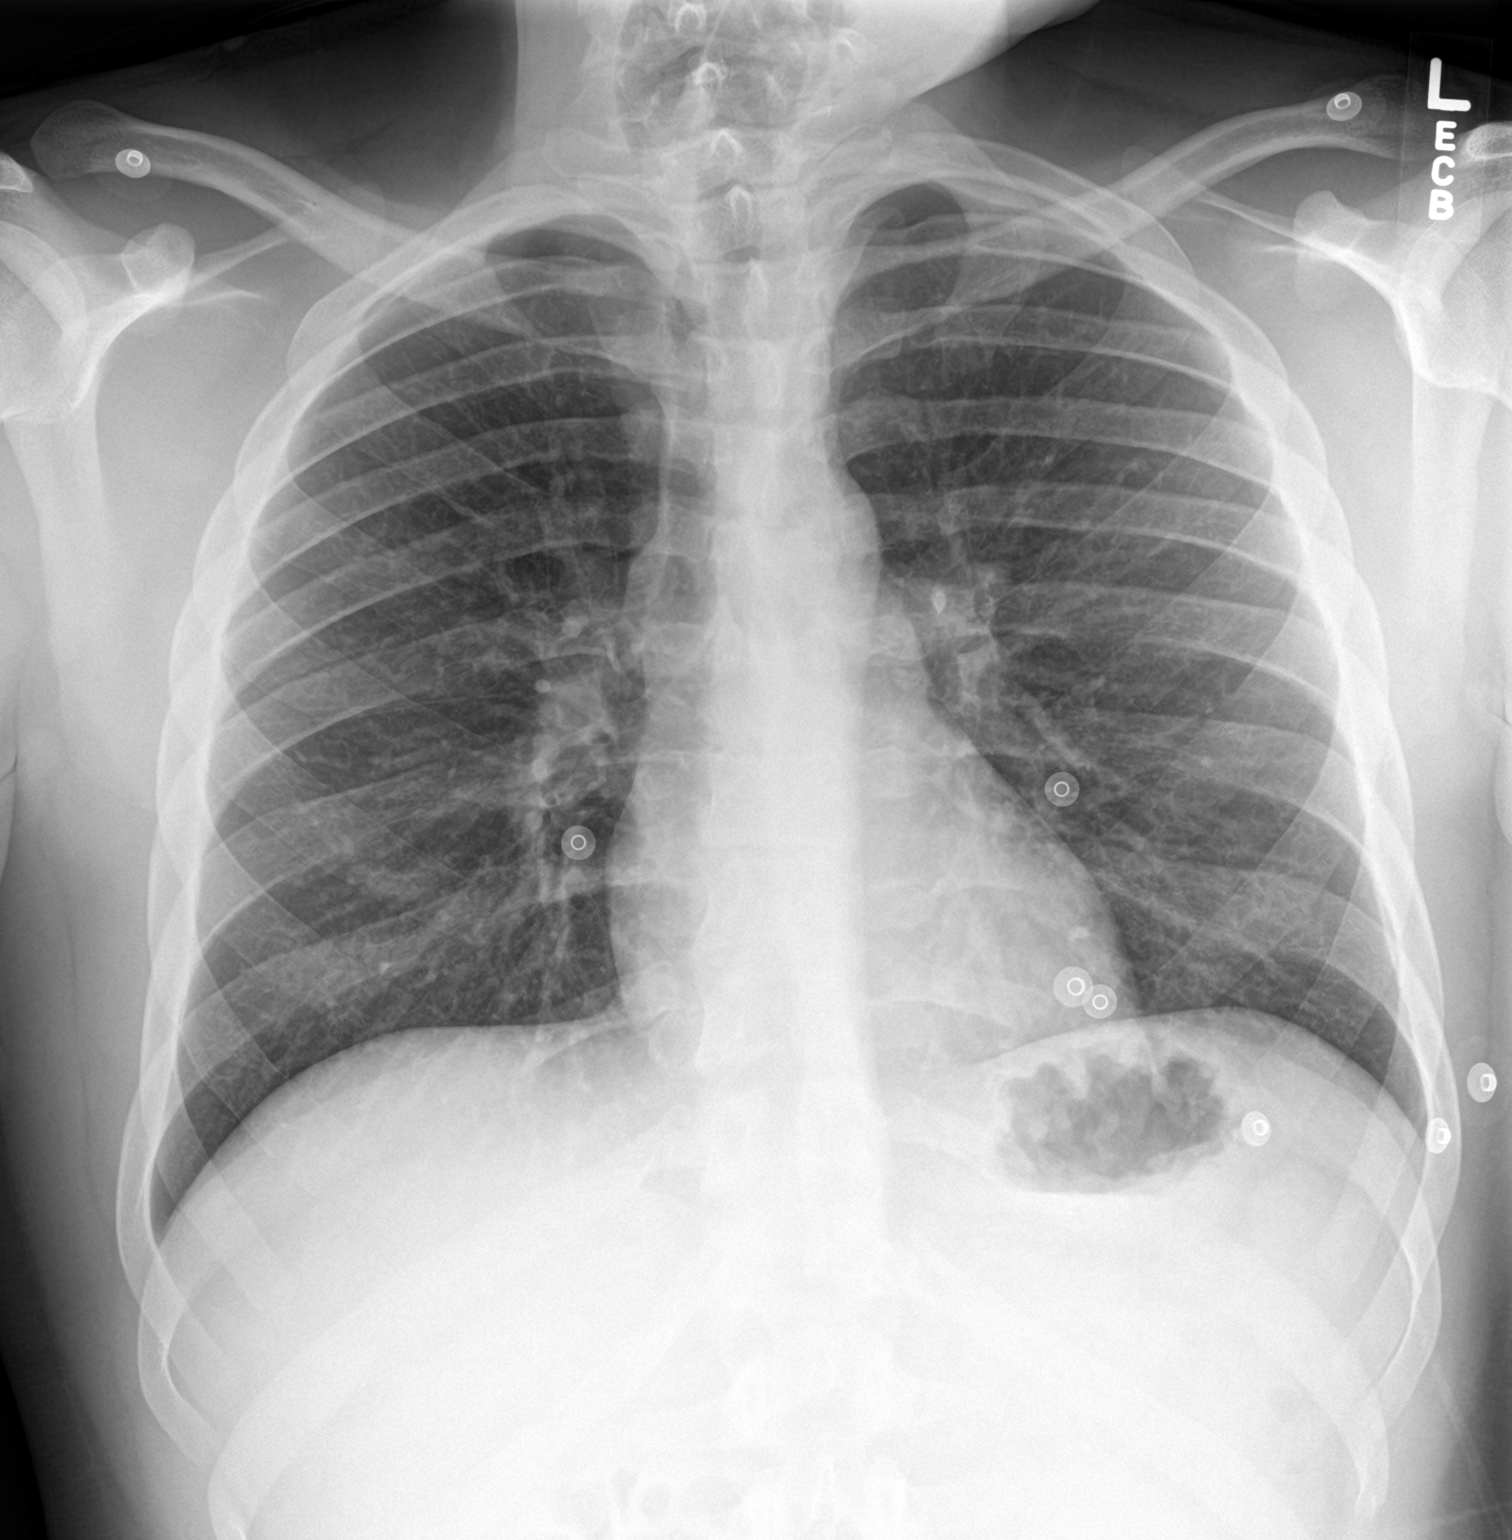

[chest lat]
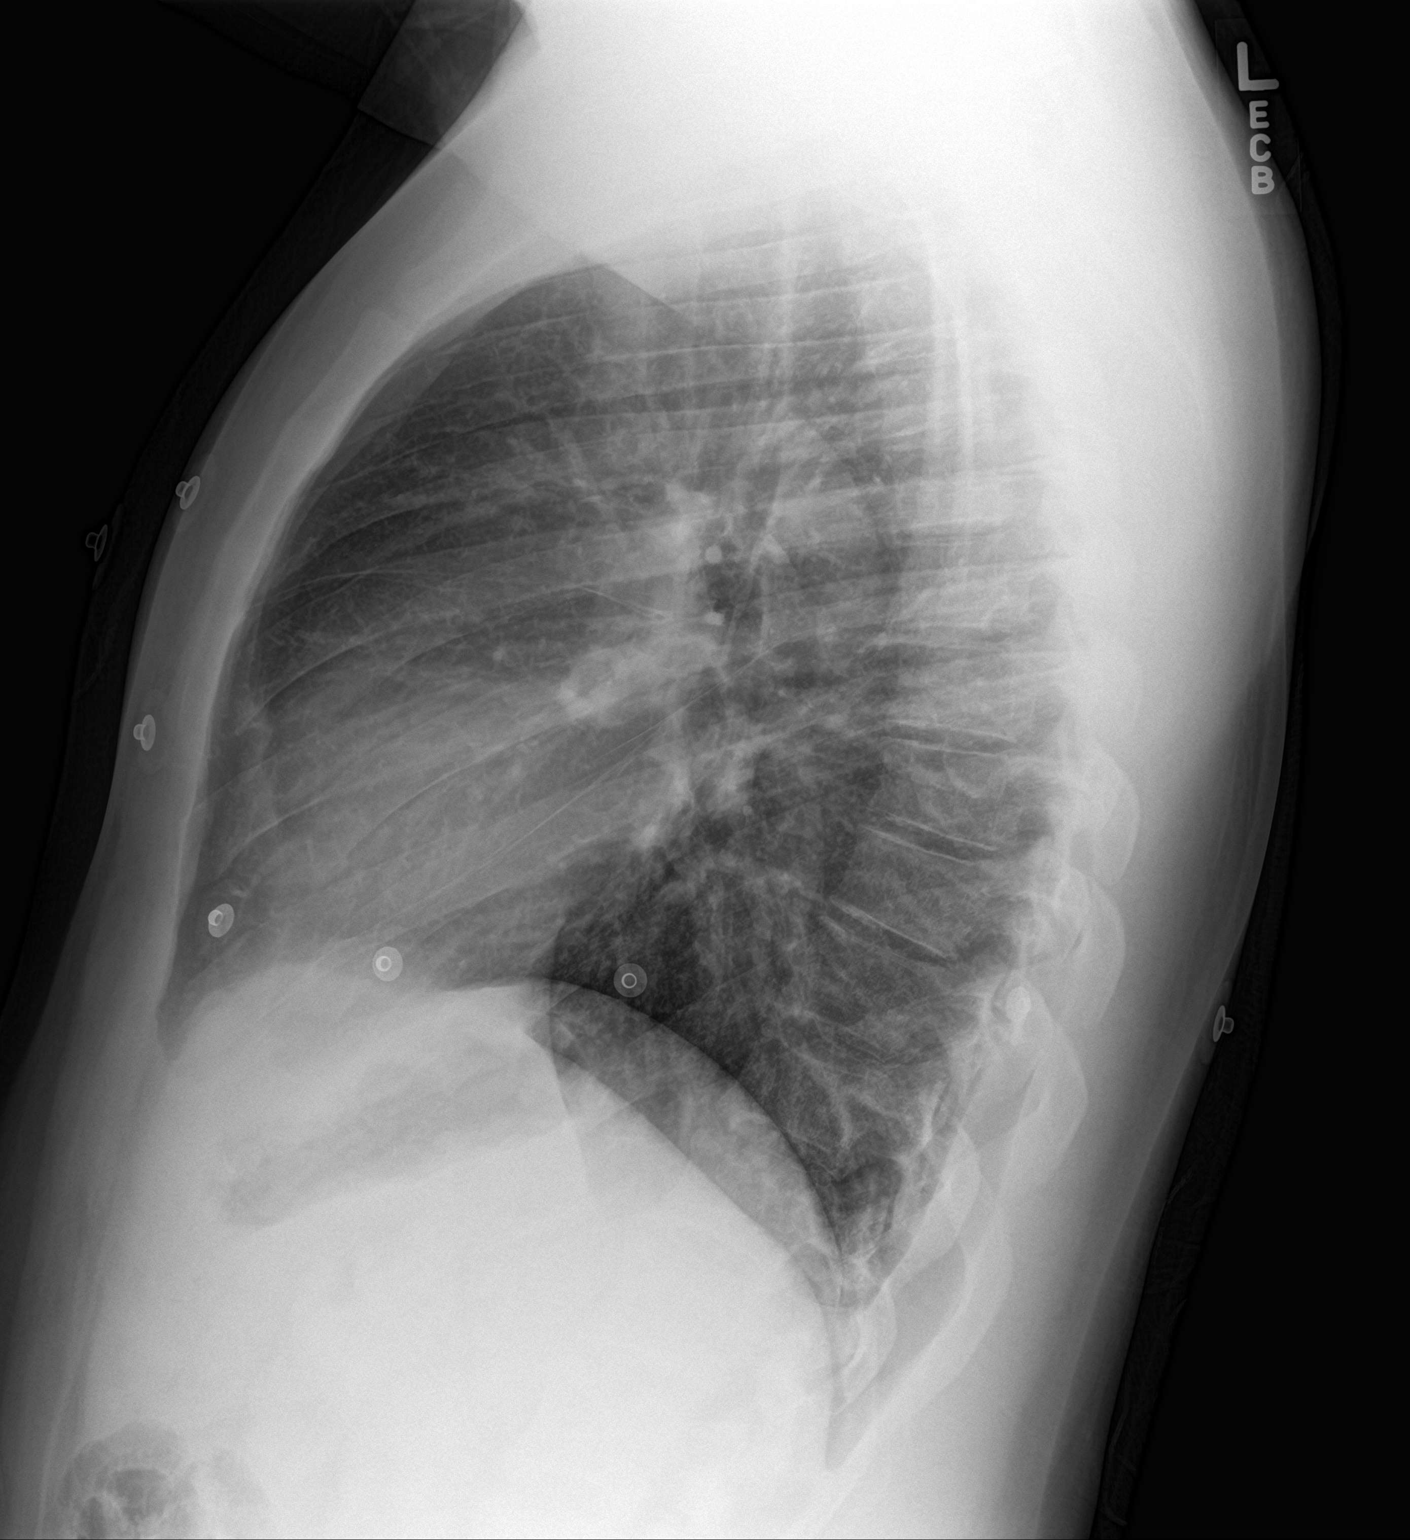

[2 of 2 positions shown; findings below may reference images not displayed]

FINDINGS: The heart size and mediastinal contours are within normal limits.
Both lungs are clear. The visualized skeletal structures are
unremarkable.
IMPRESSION: No active cardiopulmonary disease.
